# Patient Record
Sex: Male | Born: 1960 | Race: White | Hispanic: No | Marital: Married | Smoking: Never smoker
Health system: Southern US, Community
[De-identification: ages and names within clinical notes are randomized; demographics above are authoritative.]

## PROBLEM LIST (undated history)

## (undated) HISTORY — PX: FINGER SURGERY: SHX640

## (undated) HISTORY — PX: FRACTURE SURGERY: SHX138

## (undated) HISTORY — PX: VASECTOMY: SHX75

## (undated) HISTORY — PX: KNEE SURGERY: SHX244

---

## 2014-01-20 ENCOUNTER — Other Ambulatory Visit: Payer: Self-pay | Admitting: Gastroenterology

## 2014-01-21 NOTE — Addendum Note (Signed)
Addended by: Arta Silence on: 01/21/2014 12:25 PM   Modules accepted: Orders

## 2014-01-22 ENCOUNTER — Encounter (HOSPITAL_COMMUNITY)
Admission: RE | Disposition: A | Discharge status: Home / Self Care | Payer: Self-pay | Source: Ambulatory Visit | Attending: Gastroenterology

## 2014-01-22 ENCOUNTER — Encounter (HOSPITAL_COMMUNITY): Payer: No Typology Code available for payment source | Admitting: Anesthesiology

## 2014-01-22 ENCOUNTER — Encounter (HOSPITAL_COMMUNITY): Payer: Self-pay | Admitting: *Deleted

## 2014-01-22 ENCOUNTER — Ambulatory Visit (HOSPITAL_COMMUNITY): Payer: No Typology Code available for payment source | Admitting: Anesthesiology

## 2014-01-22 ENCOUNTER — Ambulatory Visit (HOSPITAL_COMMUNITY)
Admission: RE | Admit: 2014-01-22 | Discharge: 2014-01-22 | Disposition: A | Discharge status: Home / Self Care | Payer: No Typology Code available for payment source | Source: Ambulatory Visit | Attending: Gastroenterology | Admitting: Gastroenterology

## 2014-01-22 DIAGNOSIS — K5730 Diverticulosis of large intestine without perforation or abscess without bleeding: Secondary | ICD-10-CM | POA: Insufficient documentation

## 2014-01-22 DIAGNOSIS — D126 Benign neoplasm of colon, unspecified: Secondary | ICD-10-CM | POA: Insufficient documentation

## 2014-01-22 DIAGNOSIS — Z1211 Encounter for screening for malignant neoplasm of colon: Secondary | ICD-10-CM | POA: Insufficient documentation

## 2014-01-22 HISTORY — PX: COLONOSCOPY: SHX5424

## 2014-01-22 SURGERY — COLONOSCOPY
Anesthesia: Monitor Anesthesia Care

## 2014-01-22 MED ORDER — FENTANYL CITRATE 0.05 MG/ML IJ SOLN
INTRAMUSCULAR | Status: DC | PRN
  Administered 2014-01-22 (×2): 25 ug via INTRAVENOUS

## 2014-01-22 MED ORDER — MIDAZOLAM HCL 5 MG/5ML IJ SOLN
INTRAMUSCULAR | Status: DC | PRN
  Administered 2014-01-22: 1 mg via INTRAVENOUS
  Administered 2014-01-22: 2 mg via INTRAVENOUS
  Administered 2014-01-22: 1 mg via INTRAVENOUS

## 2014-01-22 MED ORDER — SPOT INK MARKER SYRINGE KIT
PACK | SUBMUCOSAL | Status: AC
  Filled 2014-01-22: qty 5

## 2014-01-22 MED ORDER — FENTANYL CITRATE 0.05 MG/ML IJ SOLN
INTRAMUSCULAR | Status: AC
  Filled 2014-01-22: qty 2

## 2014-01-22 MED ORDER — SODIUM CHLORIDE 0.9 % IV SOLN: INTRAVENOUS | Status: DC

## 2014-01-22 MED ORDER — MIDAZOLAM HCL 10 MG/2ML IJ SOLN
INTRAMUSCULAR | Status: AC
  Filled 2014-01-22: qty 2

## 2014-01-22 NOTE — Anesthesia Preprocedure Evaluation (Deleted)
Anesthesia Evaluation  Patient identified by MRN, date of birth, ID band Patient awake    Reviewed: Allergy & Precautions, H&P , NPO status , Patient's Chart, lab work & pertinent test results  Airway Mallampati: II TM Distance: >3 FB Neck ROM: Full    Dental no notable dental hx.    Pulmonary neg pulmonary ROS,  breath sounds clear to auscultation  Pulmonary exam normal       Cardiovascular negative cardio ROS  Rhythm:Regular Rate:Normal     Neuro/Psych negative neurological ROS  negative psych ROS   GI/Hepatic negative GI ROS, Neg liver ROS,   Endo/Other  negative endocrine ROS  Renal/GU negative Renal ROS  negative genitourinary   Musculoskeletal negative musculoskeletal ROS (+)   Abdominal   Peds negative pediatric ROS (+)  Hematology negative hematology ROS (+)   Anesthesia Other Findings   Reproductive/Obstetrics negative OB ROS                           Anesthesia Physical Anesthesia Plan  ASA: I  Anesthesia Plan: MAC   Post-op Pain Management:    Induction: Intravenous  Airway Management Planned: Nasal Cannula  Additional Equipment:   Intra-op Plan:   Post-operative Plan:   Informed Consent: I have reviewed the patients History and Physical, chart, labs and discussed the procedure including the risks, benefits and alternatives for the proposed anesthesia with the patient or authorized representative who has indicated his/her understanding and acceptance.   Dental advisory given  Plan Discussed with: CRNA and Surgeon  Anesthesia Plan Comments:         Anesthesia Quick Evaluation

## 2014-01-22 NOTE — Op Note (Signed)
[facility] [place], [address]   COLONOSCOPY PROCEDURE REPORT  PATIENT: Noah Patterson, Noah Patterson  MR#: [id number] BIRTHDATE: 12-16-1960 , 53  yrs. old GENDER: Male ENDOSCOPIST: Arta Silence, MD REFERRED GE:XBMWU Cloward, M.D. PROCEDURE DATE:  01/22/2014 PROCEDURE:   Colonoscopy with snare polypectomy, Submucosal injection, any substance, and Colonoscopy with biopsy ASA CLASS:   Class I INDICATIONS:Average risk patient for colon cancer. MEDICATIONS: Fentanyl 50 mcg IV and Versed 4 mg IV  DESCRIPTION OF PROCEDURE:   After the risks benefits and alternatives of the procedure were thoroughly explained, informed consent was obtained.  A digital rectal exam revealed no abnormalities of the rectum.   The Pentax Ped Colon [id number] endoscope was introduced through the anus and advanced to the cecum, which was identified by both the appendix and ileocecal valve. No adverse events experienced.   The quality of the prep was adequate.  The instrument was then slowly withdrawn as the colon was fully examined.   Findings:  Digital rectal exam normal.  Prep quality adequate.  Few sigmoid and descending colon diverticula.  74mm ascending colon polyp, removed with cold snare.  Large clamshell type polyp, extremely subtle, deep within ravine between two folds, at the level of the hepatic flexure, measuring about 3.5 cm 1.0 cm in size; if appears to encompass about 1/3rd of the cirumference of the colon in this area.  I tried repeatedly to remove this polyp, but was unsuccessful, due in part to bowel spasm and also in part to the very difficult positioning of the polyp deep between two folds, as well as the awkward shape of the polyp.  Ultimately, I elected to biopsy the polyp (with snare and biopsy forceps) and marked with 2cc of [place] Ink the proximal and distal fold between which the polyp lies.  No other polyps, masses, vascular ectasias, or inflammatory changes were  seen.  Mild internal hemorrhoids, otherwise normal retroflexed view of rectum.       Withdrawal time was   .  The scope was withdrawn and the procedure completed.  ENDOSCOPIC IMPRESSION:     As above.  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  RECOMMENDATIONS:     1.  Watch for potential complications of procedure. 2.  Await biopsy results. 3.  Pending biopsy results, will consider tertiary center referral to Dr. Ivor Messier for consideration of endoscopic mucosal resection of the hepatic flexure polyp.  eSigned:  Arta Silence, MD 01/22/2014 2:08 PM   cc:

## 2014-01-22 NOTE — H&P (Signed)
Patient interval history reviewed.  Patient examined again.  There has been no change from documented H/P dated 01/09/14 (scanned into chart from our office) except as documented above.  Assessment:  1.  Average-risk colon cancer screening.  Plan:  1.  Screening colonoscopy. 2.  Risks (bleeding, infection, bowel perforation that could require surgery, sedation-related changes in cardiopulmonary systems), benefits (identification and possible treatment of source of symptoms, exclusion of certain causes of symptoms), and alternatives (watchful waiting, radiographic imaging studies, empiric medical treatment) of colonoscopy were explained to patient/family in detail and patient wishes to proceed.

## 2014-01-22 NOTE — Discharge Instructions (Signed)
Colonoscopy  Post procedure instructions:  Read the instructions outlined below and refer to this sheet in the next few weeks. These discharge instructions provide you with general information on caring for yourself after you leave the hospital. Your doctor may also give you specific instructions. While your treatment has been planned according to the most current medical practices available, unavoidable complications occasionally occur. If you have any problems or questions after discharge, call Dr. Paulita Fujita at [facility] [phone]).  HOME CARE INSTRUCTIONS  ACTIVITY:  You may resume your regular activity, but move at a slower pace for the next 24 hours.   Take frequent rest periods for the next 24 hours.   Walking will help get rid of the air and reduce the bloated feeling in your belly (abdomen).   No driving for 24 hours (because of the medicine (anesthesia) used during the test).   You may shower.   Do not sign any important legal documents or operate any machinery for 24 hours (because of the anesthesia used during the test).  NUTRITION:  Drink plenty of fluids.   You may resume your normal diet as instructed by your doctor.   Begin with a light meal and progress to your normal diet. Heavy or fried foods are harder to digest and may make you feel sick to your stomach (nauseated).   Avoid alcoholic beverages for 24 hours or as instructed.  MEDICATIONS:  You may resume your normal medications unless your doctor tells you otherwise.  WHAT TO EXPECT TODAY:  Some feelings of bloating in the abdomen.   Passage of more gas than usual.   Spotting of blood in your stool or on the toilet paper.  IF YOU HAD POLYPS REMOVED DURING THE COLONOSCOPY:  No aspirin products for 7 days or as instructed.   No alcohol for 7 days or as instructed.   Eat a soft diet for the next 24 hours.   FINDING OUT THE RESULTS OF YOUR TEST  Not all test results are available during your  visit. If your test results are not back during the visit, make an appointment with your caregiver to find out the results. Do not assume everything is normal if you have not heard from your caregiver or the medical facility. It is important for you to follow up on all of your test results.     SEEK IMMEDIATE MEDICAL CARE IF:   You have more than a spotting of blood in your stool.   Your belly is swollen (abdominal distention).   You are nauseated or vomiting.   You have a fever.   You have abdominal pain or discomfort that is severe or gets worse throughout the day.    Document Released: 03/08/2004 Document Revised: 04/06/2011 Document Reviewed: 03/06/2008 [facility] Patient Information 2012 [place]. Colonoscopy Care After These instructions give you information on caring for yourself after your procedure. Your doctor may also give you more specific instructions. Call your doctor if you have any problems or questions after your procedure. HOME CARE  Take it easy for the next 24 hours.  Rest.  Walk or use warm packs on your belly (abdomen) if you have belly cramping or gas.  Do not drive for 24 hours.  You may shower.  Do not sign important papers or use machinery for 24 hours.  Drink enough fluids to keep your pee (urine) clear or pale yellow.  Resume your normal diet. Avoid heavy or fried foods.  Avoid alcohol.  Continue taking  your normal medicines.  Only take medicine as told by your doctor. Do not take aspirin. If you had growths (polyps) removed:  Do not take aspirin.  Do not drink alcohol for 7 days or as told by your doctor.  Eat a soft diet for 24 hours. GET HELP RIGHT AWAY IF:  You have a fever.  You pass clumps of tissue (blood clots) or fill the toilet with blood.  You have belly pain that gets worse and medicine does not help.  Your belly is puffy (swollen).  You feel sick to your stomach (nauseous) or throw up (vomit). MAKE SURE  YOU:  Understand these instructions.  Will watch your condition.  Will get help right away if you are not doing well or get worse. Document Released: 08/27/2010 Document Revised: 10/17/2011 Document Reviewed: 04/01/2013 [facility] Patient Information 2014 [place].

## 2014-01-23 ENCOUNTER — Encounter (HOSPITAL_COMMUNITY): Payer: Self-pay | Admitting: Gastroenterology

## 2020-07-08 DIAGNOSIS — U071 COVID-19: Secondary | ICD-10-CM

## 2020-07-08 HISTORY — DX: COVID-19: U07.1

## 2020-11-30 ENCOUNTER — Encounter: Payer: Self-pay | Admitting: Emergency Medicine

## 2020-11-30 ENCOUNTER — Emergency Department (INDEPENDENT_AMBULATORY_CARE_PROVIDER_SITE_OTHER)
Admission: EM | Admit: 2020-11-30 | Discharge: 2020-11-30 | Disposition: A | Discharge status: Home / Self Care | Payer: PRIVATE HEALTH INSURANCE | Source: Home / Self Care

## 2020-11-30 ENCOUNTER — Emergency Department (INDEPENDENT_AMBULATORY_CARE_PROVIDER_SITE_OTHER): Payer: No Typology Code available for payment source

## 2020-11-30 ENCOUNTER — Other Ambulatory Visit: Payer: Self-pay

## 2020-11-30 DIAGNOSIS — M25562 Pain in left knee: Secondary | ICD-10-CM

## 2020-11-30 DIAGNOSIS — M6289 Other specified disorders of muscle: Secondary | ICD-10-CM

## 2020-11-30 DIAGNOSIS — S8992XA Unspecified injury of left lower leg, initial encounter: Secondary | ICD-10-CM

## 2020-11-30 MED ORDER — ACETAMINOPHEN 325 MG PO TABS
650.0000 mg | ORAL_TABLET | Freq: Once | ORAL | Status: AC
Start: 2020-11-30 — End: 2020-11-30
  Administered 2020-11-30: 650 mg via ORAL

## 2020-11-30 NOTE — ED Provider Notes (Addendum)
[facility]    CSN: [id number] Arrival date & time: 11/30/20  2007      History   Chief Complaint Chief Complaint  Patient presents with  . Leg Injury    left    HPI Noah Patterson is a 60 y.o. male.   Reports left knee pain and swelling after dropping furniture on the left knee today.  Reports that he is having trouble bearing weight to the left knee as well as limited ROM.  Has not attempted OTC treatment.  Denies previous symptoms.  Denies radiating pain, other injury, hitting his head with the furniture, loss of consciousness, change in vision, other symptoms.  ROS per HPI  The history is provided by the patient.    Past Medical History:  Diagnosis Date  . COVID-19 07/2020    There are no problems to display for this patient.   Past Surgical History:  Procedure Laterality Date  . COLONOSCOPY N/A 01/22/2014   Procedure: COLONOSCOPY;  Surgeon: Arta Silence, MD;  Location: [facility];  Service: Endoscopy;  Laterality: N/A;  . FINGER SURGERY  tumor  . FRACTURE SURGERY  broken nose  . KNEE SURGERY    . VASECTOMY         Home Medications    Prior to Admission medications   Medication Sig Start Date End Date Taking? Authorizing Provider  Ascorbic Acid (VITAMIN C) 1000 MG tablet Take 6,000 mg by mouth.   Yes [provider]  Coconut Oil 1000 MG CAPS Take 1,000 mg by mouth daily.   Yes [provider]  Magnesium 500 MG CAPS Take by mouth.   Yes [provider]  Omega-3 Fatty Acids (FISH OIL) 1000 MG CAPS Take 1,000 mg by mouth daily.   Yes [provider]  Selenium 200 MCG CAPS Take by mouth.   Yes [provider]  sildenafil (VIAGRA) 100 MG tablet  02/04/14  Yes [provider]  zinc gluconate 50 MG tablet Take by mouth.   Yes [provider]  Glucosamine-Chondroitin 500-400 MG CAPS Take 500 mg by mouth daily. Patient not taking: Reported on 11/30/2020    [provider]   valACYclovir (VALTREX) 500 MG tablet Take by mouth. 04/01/14 07/27/21  [provider]    Family History Family History  Problem Relation Age of Onset  . Lung cancer Mother   . Alzheimer's disease Father   . Pneumonia Father     Social History Social History   Tobacco Use  . Smoking status: Never Smoker  . Smokeless tobacco: Never Used  Substance Use Topics  . Alcohol use: Yes    Comment: occasional  . Drug use: No     Allergies   Patient has no known allergies.   Review of Systems Review of Systems   Physical Exam Triage Vital Signs ED Triage Vitals  Enc Vitals Group     BP      Pulse      Resp      Temp      Temp src      SpO2      Weight      Height      Head Circumference      Peak Flow      Pain Score      Pain Loc      Pain Edu?      Excl. in [place]?    No data found.  Updated Vital Signs BP (!) 136/95 (BP Location: Left  Arm)   Pulse 96   Temp 99 F (37.2 C) (Oral)   Resp 16   SpO2 97%    Physical Exam Vitals and nursing note reviewed.  Constitutional:      General: He is not in acute distress.    Appearance: Normal appearance. He is well-developed and normal weight. He is not ill-appearing.  HENT:     Head: Normocephalic and atraumatic.  Eyes:     Extraocular Movements: Extraocular movements intact.     Conjunctiva/sclera: Conjunctivae normal.     Pupils: Pupils are equal, round, and reactive to light.  Cardiovascular:     Rate and Rhythm: Normal rate and regular rhythm.     Heart sounds: No murmur heard.   Pulmonary:     Effort: Pulmonary effort is normal. No respiratory distress.     Breath sounds: Normal breath sounds.  Abdominal:     Palpations: Abdomen is soft.     Tenderness: There is no abdominal tenderness.  Musculoskeletal:        General: Swelling, tenderness and signs of injury present.     Cervical back: Normal range of motion and neck supple.     Comments: Superior medial aspect of L knee  Skin:     General: Skin is warm and dry.     Capillary Refill: Capillary refill takes less than 2 seconds.  Neurological:     General: No focal deficit present.     Mental Status: He is alert and oriented to person, place, and time.  Psychiatric:        Mood and Affect: Mood normal.        Behavior: Behavior normal.        Thought Content: Thought content normal.      UC Treatments / Results  Labs (all labs ordered are listed, but only abnormal results are displayed) Labs Reviewed - No data to display  EKG   Radiology DG Knee Complete 4 Views Left  Result Date: 11/30/2020 CLINICAL DATA:  Dropped a piece of furniture on left knee with limited range of motion. EXAM: LEFT KNEE - COMPLETE 4+ VIEW COMPARISON:  None. FINDINGS: No acute fracture. No dislocation. Joint spaces are normal. Mild tricompartmental peripheral spurring. There is a moderate knee joint effusion. 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 Soft tissue edema noted anterior aspect of the thigh. IMPRESSION: 1. No acute fracture or dislocation. 2. Moderate knee joint effusion.  Soft tissue edema anteriorly. 3. Mild tricompartmental osteoarthritis. 4. 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 Electronically Signed   By: Keith Rake M.D.   On: 11/30/2020 20:36    Procedures Procedures (including critical care time)  Medications Ordered in UC Medications  acetaminophen (TYLENOL) tablet 650 mg (650 mg Oral Given 11/30/20 2039)    Initial Impression / Assessment and Plan / UC Course  I have reviewed the triage vital signs and the nursing notes.  Pertinent labs & imaging results that were available during my care of the patient were reviewed by me and considered in my medical decision making (see chart for details).    L Knee Pain Left knee injury  X-ray today is negative for any fracture or misalignment We have Ace wrap to your knee today May use compression,  elevation, ice, rest for the next day or 2 We have given you Tylenol in office today for pain May continue ibuprofen and Tylenol at home for pain as needed Follow-up with sports medicine if symptoms  are persisting   Final Clinical Impressions(s) / UC Diagnoses   Final diagnoses:  Acute pain of left knee  Injury of left knee, initial encounter     Discharge Instructions     Your x-ray is negative for fracture or misalignment today  We have wrapped your knee today just to provide some support for the muscle  May continue to take ibuprofen or Tylenol as needed for pain  Apply ice to the area as needed  If symptoms are persisting, follow-up with sports medicine    ED Prescriptions    None     PDMP not reviewed this encounter.   Faustino Congress, NP 11/30/20 2043    Faustino Congress, NP 11/30/20 2045

## 2020-11-30 NOTE — Discharge Instructions (Addendum)
Your x-ray is negative for fracture or misalignment today  We have wrapped your knee today just to provide some support for the muscle  May continue to take ibuprofen or Tylenol as needed for pain  Apply ice to the area as needed  If symptoms are persisting, follow-up with sports medicine

## 2020-11-30 NOTE — ED Triage Notes (Signed)
Pt missed a step & furniture landed on his left leg/knee Limping on arrival - triage to be completed on return No OTC pain meds

## 2021-07-24 IMAGING — DX DG KNEE COMPLETE 4+V*L*
4 series · 4 of 4 positions shown · non-contrast
Comparison: None.

CLINICAL DATA: Dropped a piece of furniture on left knee with
limited range of motion.

EXAM:
LEFT KNEE - COMPLETE 4+ VIEW

[knee ap]
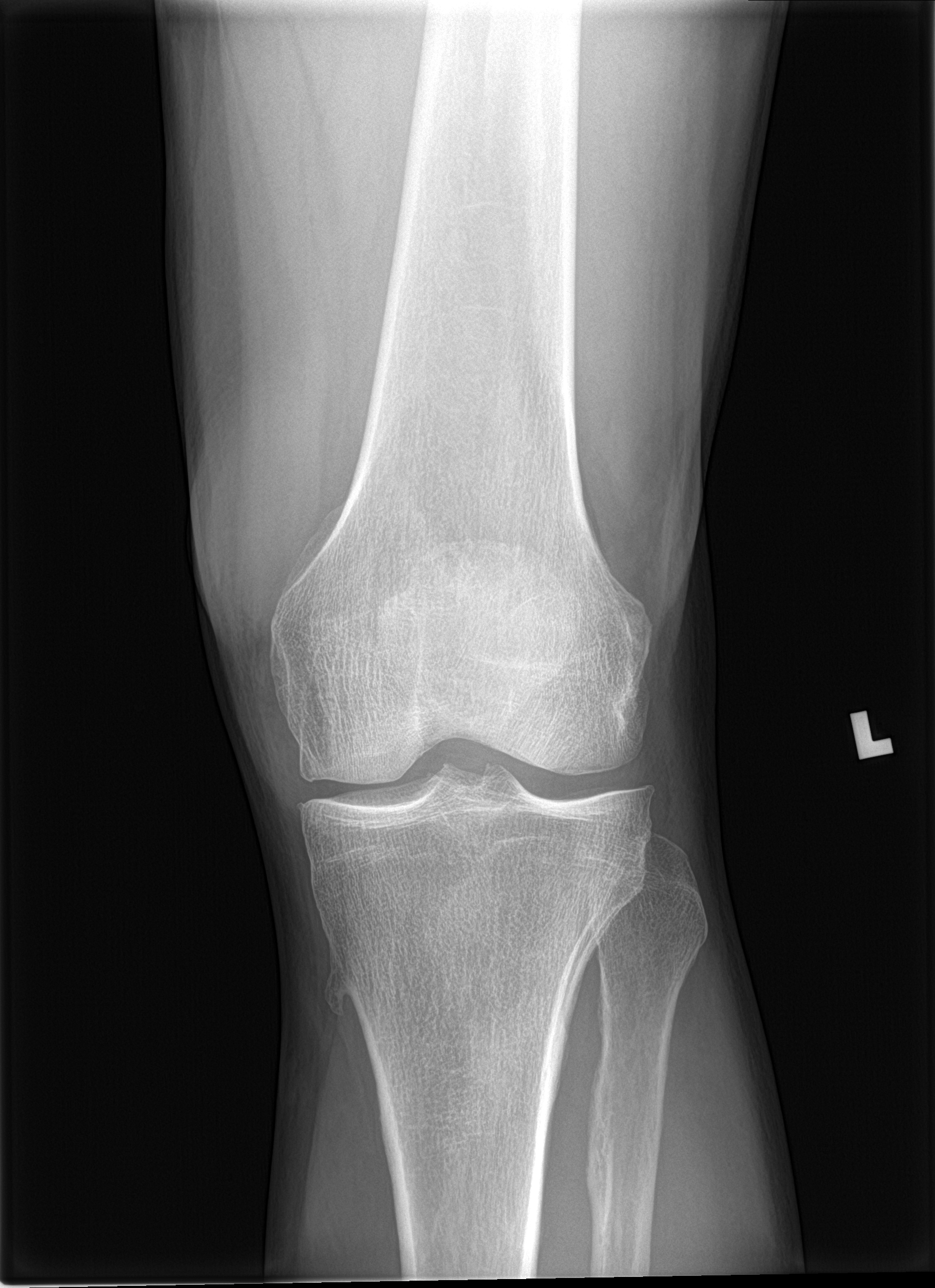

[knee lat]
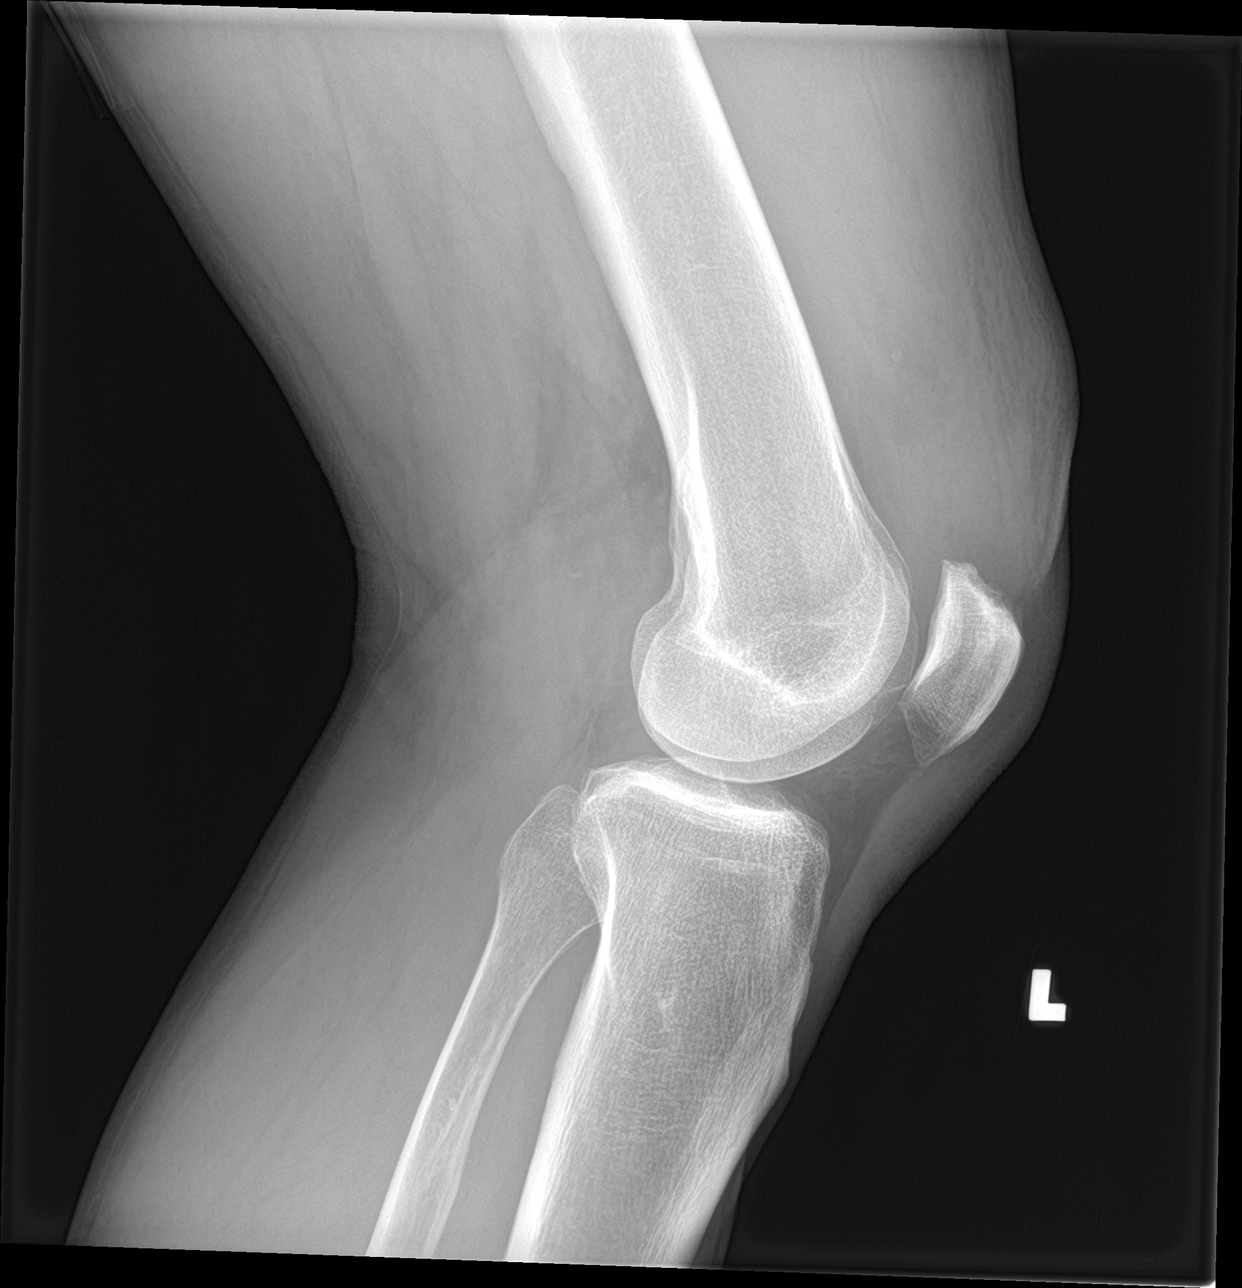

[knee obl (1 of 2)]
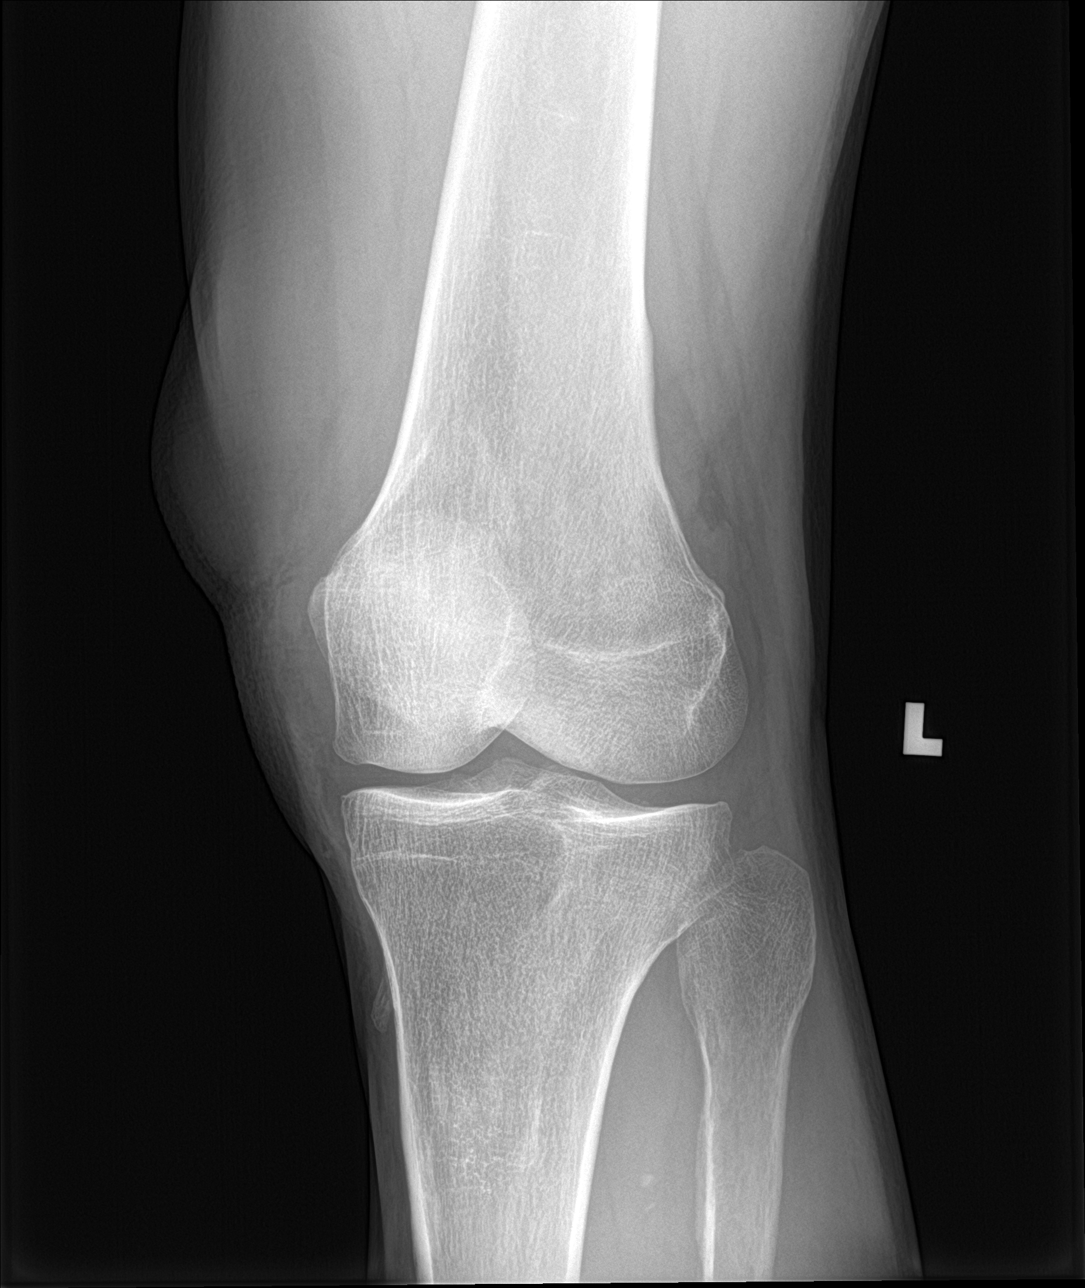

[knee obl (2 of 2)]
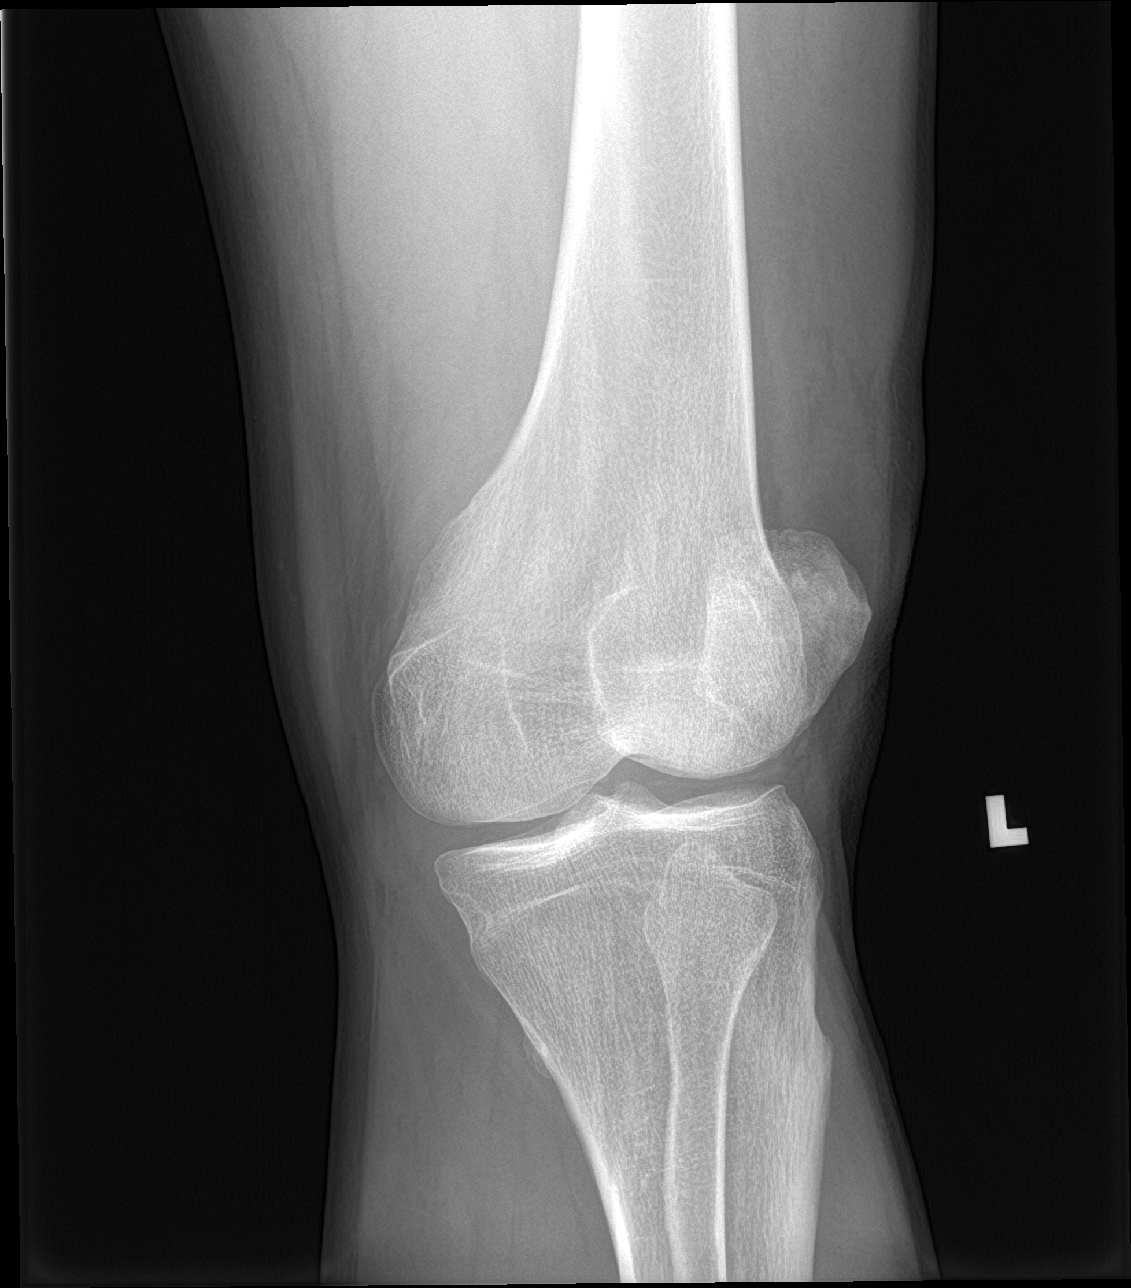

[4 of 4 positions shown; findings below may reference images not displayed]

FINDINGS: No acute fracture. No dislocation. Joint spaces are normal. Mild
tricompartmental peripheral spurring. There is a moderate knee joint
effusion. Small osteochondroma arising from the proximal medial
tibial metaphysis. Soft tissue edema noted anterior aspect of the
thigh.
IMPRESSION: 1. No acute fracture or dislocation.
2. Moderate knee joint effusion.  Soft tissue edema anteriorly.
3. Mild tricompartmental osteoarthritis.
4. Small osteochondroma arising from the proximal medial tibial
metaphysis.
